# Patient Record
Sex: Male | Born: 1970 | Race: Black or African American | Hispanic: No | Marital: Single | Smoking: Current every day smoker
Health system: Southern US, Community
[De-identification: ages and names within clinical notes are randomized; demographics above are authoritative.]

## PROBLEM LIST (undated history)

## (undated) DIAGNOSIS — I10 Essential (primary) hypertension: Secondary | ICD-10-CM

---

## 2017-05-31 ENCOUNTER — Emergency Department (INDEPENDENT_AMBULATORY_CARE_PROVIDER_SITE_OTHER)
Admission: EM | Admit: 2017-05-31 | Discharge: 2017-05-31 | Disposition: A | Discharge status: Home / Self Care | Payer: BLUE CROSS/BLUE SHIELD | Source: Home / Self Care | Attending: Family Medicine | Admitting: Family Medicine

## 2017-05-31 ENCOUNTER — Encounter: Payer: Self-pay | Admitting: Emergency Medicine

## 2017-05-31 DIAGNOSIS — J209 Acute bronchitis, unspecified: Secondary | ICD-10-CM | POA: Diagnosis not present

## 2017-05-31 HISTORY — DX: Essential (primary) hypertension: I10

## 2017-05-31 MED ORDER — IPRATROPIUM-ALBUTEROL 0.5-2.5 (3) MG/3ML IN SOLN
3.0000 mL | Freq: Once | RESPIRATORY_TRACT | Status: AC
  Administered 2017-05-31: 3 mL via RESPIRATORY_TRACT

## 2017-05-31 MED ORDER — ALBUTEROL SULFATE HFA 108 (90 BASE) MCG/ACT IN AERS: 1.0000 | INHALATION_SPRAY | Freq: Four times a day (QID) | RESPIRATORY_TRACT | 0 refills | Status: DC | PRN

## 2017-05-31 MED ORDER — METHYLPREDNISOLONE SODIUM SUCC 40 MG IJ SOLR
80.0000 mg | Freq: Once | INTRAMUSCULAR | Status: AC
  Administered 2017-05-31: 80 mg via INTRAMUSCULAR

## 2017-05-31 MED ORDER — PREDNISONE 20 MG PO TABS: ORAL_TABLET | ORAL | 0 refills | Status: DC

## 2017-05-31 MED ORDER — BENZONATATE 100 MG PO CAPS: 100.0000 mg | ORAL_CAPSULE | Freq: Three times a day (TID) | ORAL | 0 refills | Status: DC

## 2017-05-31 MED ORDER — AZITHROMYCIN 250 MG PO TABS: 250.0000 mg | ORAL_TABLET | Freq: Every day | ORAL | 0 refills | Status: DC

## 2017-05-31 NOTE — Discharge Instructions (Signed)
°  You were given a shot of solumedrol (a steroid) today to help with inflammation in your lungs to help you breath better and to help with your cough.  You have been prescribed 5 days of prednisone, an oral steroid.  You may start this medication tomorrow with breakfast.   ° °You may take 500mg acetaminophen every 4-6 hours or in combination with ibuprofen 400-600mg every 6-8 hours as needed for pain, inflammation, and fever. ° °Be sure to drink at least eight 8oz glasses of water to stay well hydrated and get at least 8 hours of sleep at night, preferably more while sick.  ° °Please take antibiotics as prescribed and be sure to complete entire course even if you start to feel better to ensure infection does not come back. ° °

## 2017-05-31 NOTE — ED Triage Notes (Signed)
[facility] with [facility]/O cough, cold and congestion, chills/sweating possible fever, feels flu like. Fatigue

## 2017-05-31 NOTE — ED Provider Notes (Signed)
[facility]    CSN: [id number] Arrival date & time: 05/31/17  [id number]     History   Chief Complaint Chief Complaint  Patient presents with  . Cough  . Nasal Congestion  . Fever    HPI Ruben Perez is a 47 y.o. male.   HPI  Ruben Perez is a 47 y.o. male presenting to UC with c/o 6 days of worsening cough, chest tightness, body aches and fatigue. He reports going to the store to get some soup yesterday and "felt like I was going to have to call an Benedetto Goad[employer]ber to drive me home I was so worn out." pt is a truck driver. He notes his girlfriend was sick last week and after he left for work, he started to feel sick.  He did not get the flu vaccine this year.  No hx of asthma. He believes he may have had bronchitis in the past but never needed an inhaler.    Past Medical History:  Diagnosis Date  . Hypertension     There are no active problems to display for this patient.   History reviewed. No pertinent surgical history.     Home Medications    Prior to Admission medications   Medication Sig Start Date End Date Taking? Authorizing Provider  benazepril (LOTENSIN) 20 MG tablet Take 20 mg by mouth daily.   Yes [provider]  albuterol (PROVENTIL HFA;VENTOLIN HFA) 108 (90 Base) MCG/ACT inhaler Inhale 1-2 puffs into the lungs every 6 (six) hours as needed for wheezing or shortness of breath. 05/31/17   Lurene ShadowPhelps, Zimal Weisensel O, PA-C  azithromycin (ZITHROMAX) 250 MG tablet Take 1 tablet (250 mg total) by mouth daily. Take first 2 tablets together, then 1 every day until finished. 05/31/17   Lurene ShadowPhelps, Wasil Wolke O, PA-C  benzonatate (TESSALON) 100 MG capsule Take 1-2 capsules (100-200 mg total) by mouth every 8 (eight) hours. 05/31/17   Lurene ShadowPhelps, Issaic Welliver O, PA-C  predniSONE (DELTASONE) 20 MG tablet 3 tabs po day one, then 2 po daily x 4 days 05/31/17   Lurene ShadowPhelps, Riggin Cuttino O, PA-C    Family History History reviewed. No pertinent family history.  Social History Social History   Tobacco Use  .  Smoking status: Current Every Day Smoker  . Smokeless tobacco: Never Used  Substance Use Topics  . Alcohol use: Yes  . Drug use: No     Allergies   Penicillins   Review of Systems Review of Systems  Constitutional: Positive for fatigue. Negative for chills and fever.  HENT: Positive for congestion and rhinorrhea. Negative for ear pain, sore throat, trouble swallowing and voice change.   Respiratory: Positive for cough, chest tightness, shortness of breath and wheezing.   Cardiovascular: Negative for chest pain and palpitations.  Gastrointestinal: Negative for abdominal pain, diarrhea, nausea and vomiting.  Musculoskeletal: Positive for arthralgias, back pain and myalgias.       Body aches  Skin: Negative for rash.  Neurological: Positive for headaches. Negative for dizziness and light-headedness.     Physical Exam Triage Vital Signs ED Triage Vitals [05/31/17 0953]  Enc Vitals Group     BP 126/80     Pulse Rate 75     Resp 16     Temp 98.8 F (37.1 C)     Temp Source Oral     SpO2 96 %     Weight      Height      Head Circumference      Peak Flow  Pain Score      Pain Loc      Pain Edu?      Excl. in GC?    No data found.  Updated Vital Signs BP 126/80 (BP Location: Right Arm)   Pulse 75   Temp 98.8 F (37.1 C) (Oral)   Resp 16   Ht 5' 10.5" (1.791 m)   Wt 260 lb (117.9 kg)   SpO2 96%   BMI 36.78 kg/m   Visual Acuity Right Eye Distance:   Left Eye Distance:   Bilateral Distance:    Right Eye Near:   Left Eye Near:    Bilateral Near:     Physical Exam  Constitutional: He is oriented to person, place, and time. He appears well-developed and well-nourished. No distress.  HENT:  Head: Normocephalic and atraumatic.  Right Ear: Tympanic membrane normal.  Left Ear: Tympanic membrane normal.  Nose: Nose normal. Right sinus exhibits no maxillary sinus tenderness and no frontal sinus tenderness. Left sinus exhibits no maxillary sinus tenderness and  no frontal sinus tenderness.  Mouth/Throat: Uvula is midline, oropharynx is clear and moist and mucous membranes are normal.  Eyes: EOM are normal.  Neck: Normal range of motion. Neck supple.  Cardiovascular: Normal rate and regular rhythm.  Pulmonary/Chest: Effort normal. No stridor. No respiratory distress. He has no decreased breath sounds. He has wheezes. He has rhonchi. He has rales.  Diffuse wheeze and coarse breath sounds. Able to speak in full sentences. No accessory muscle use.   Musculoskeletal: Normal range of motion.  Neurological: He is alert and oriented to person, place, and time.  Skin: Skin is warm and dry. He is not diaphoretic.  Psychiatric: He has a normal mood and affect. His behavior is normal.  Nursing note and vitals reviewed.    UC Treatments / Results  Labs (all labs ordered are listed, but only abnormal results are displayed) Labs Reviewed - No data to display  EKG  EKG Interpretation None       Radiology No results found.  Procedures Procedures (including critical care time)  Medications Ordered in UC Medications  methylPREDNISolone sodium succinate (SOLU-MEDROL) 40 mg/mL injection 80 mg (80 mg Intramuscular Given 05/31/17 1008)  ipratropium-albuterol (DUONEB) 0.5-2.5 (3) MG/3ML nebulizer solution 3 mL (3 mLs Nebulization Given 05/31/17 1008)     Initial Impression / Assessment and Plan / UC Course  I have reviewed the triage vital signs and the nursing notes.  Pertinent labs & imaging results that were available during my care of the patient were reviewed by me and considered in my medical decision making (see chart for details).     Hx and exam c/w acute bronchitis with bronchospam.  O2 Sat is 96% on RA No evidence of respiratory distress on exam.  Duoneb and solumedrol given in UC Will cover for likely underlying bacterial infection with Azithromycin F/u with PCP in 1 week if needed, sooner if worsening.  Home care instructions provided.     Final Clinical Impressions(s) / UC Diagnoses   Final diagnoses:  Bronchospasm with bronchitis, acute    ED Discharge Orders        Ordered    azithromycin (ZITHROMAX) 250 MG tablet  Daily     05/31/17 1026    predniSONE (DELTASONE) 20 MG tablet     05/31/17 1026    benzonatate (TESSALON) 100 MG capsule  Every 8 hours     05/31/17 1026    albuterol (PROVENTIL HFA;VENTOLIN HFA) 108 (90 Base) MCG/ACT  inhaler  Every 6 hours PRN     05/31/17 1026       Controlled Substance Prescriptions [place] Controlled Substance Registry consulted? Not Applicable   Rolla Plate 05/31/17 1038

## 2017-06-06 ENCOUNTER — Emergency Department (INDEPENDENT_AMBULATORY_CARE_PROVIDER_SITE_OTHER)
Admission: EM | Admit: 2017-06-06 | Discharge: 2017-06-06 | Disposition: A | Discharge status: Home / Self Care | Payer: BLUE CROSS/BLUE SHIELD | Source: Home / Self Care | Attending: Family Medicine | Admitting: Family Medicine

## 2017-06-06 ENCOUNTER — Encounter: Payer: Self-pay | Admitting: Emergency Medicine

## 2017-06-06 ENCOUNTER — Emergency Department (INDEPENDENT_AMBULATORY_CARE_PROVIDER_SITE_OTHER): Payer: BLUE CROSS/BLUE SHIELD

## 2017-06-06 DIAGNOSIS — J9801 Acute bronchospasm: Secondary | ICD-10-CM | POA: Diagnosis not present

## 2017-06-06 DIAGNOSIS — R918 Other nonspecific abnormal finding of lung field: Secondary | ICD-10-CM

## 2017-06-06 DIAGNOSIS — F17200 Nicotine dependence, unspecified, uncomplicated: Secondary | ICD-10-CM

## 2017-06-06 MED ORDER — METHYLPREDNISOLONE ACETATE 80 MG/ML IJ SUSP
120.0000 mg | Freq: Once | INTRAMUSCULAR | Status: AC
  Administered 2017-06-06: 120 mg via INTRAMUSCULAR

## 2017-06-06 MED ORDER — AEROCHAMBER PLUS W/MASK MISC: 2 refills | Status: DC

## 2017-06-06 MED ORDER — PREDNISONE 20 MG PO TABS: ORAL_TABLET | ORAL | 0 refills | Status: DC

## 2017-06-06 MED ORDER — IPRATROPIUM-ALBUTEROL 0.5-2.5 (3) MG/3ML IN SOLN
3.0000 mL | Freq: Once | RESPIRATORY_TRACT | Status: AC
  Administered 2017-06-06: 3 mL via RESPIRATORY_TRACT

## 2017-06-06 NOTE — ED Triage Notes (Signed)
Pt c/o wheezing at night, fatigue and vomiting today. States he has been sick all week. He completed prednisone with no relief.

## 2017-06-06 NOTE — ED Provider Notes (Signed)
[facility]    CSN: [id number] Arrival date & time: 06/06/17  1805     History   Chief Complaint Chief Complaint  Patient presents with  . Fatigue    HPI Ruben Perez is a 47 y.o. male.   HPI  Ruben Perez is a 47 y.o. male presenting to UC with c/o persistent fatigue with chest tightness and SOB despite completing a course of azithromycin and prednisone last week for bronchitis.  He was seen initially at [facility] on 05/31/17.  Pt reports vomiting earlier today from coughing.  No prior hx of asthma.  He is a smoker.    Past Medical History:  Diagnosis Date  . Hypertension     There are no active problems to display for this patient.   History reviewed. No pertinent surgical history.     Home Medications    Prior to Admission medications   Medication Sig Start Date End Date Taking? Authorizing Provider  albuterol (PROVENTIL HFA;VENTOLIN HFA) 108 (90 Base) MCG/ACT inhaler Inhale 1-2 puffs into the lungs every 6 (six) hours as needed for wheezing or shortness of breath. 05/31/17   Lurene Shadow, PA-C  azithromycin (ZITHROMAX) 250 MG tablet Take 1 tablet (250 mg total) by mouth daily. Take first 2 tablets together, then 1 every day until finished. 05/31/17   Lurene Shadow, PA-C  benazepril (LOTENSIN) 20 MG tablet Take 20 mg by mouth daily.    [provider]  benzonatate (TESSALON) 100 MG capsule Take 1-2 capsules (100-200 mg total) by mouth every 8 (eight) hours. 05/31/17   Lurene Shadow, PA-C  predniSONE (DELTASONE) 20 MG tablet 3 tabs po day one, then 2 po daily x 4 days 06/06/17   Lurene Shadow, PA-C  Spacer/Aero-Holding Chambers (AEROCHAMBER PLUS WITH MASK) inhaler Use as instructed 06/06/17   Lurene Shadow, PA-C    Family History History reviewed. No pertinent family history.  Social History Social History   Tobacco Use  . Smoking status: Current Every Day Smoker    Packs/day: 1.00    Types: Cigarettes  . Smokeless tobacco: Never Used    Substance Use Topics  . Alcohol use: Yes  . Drug use: No     Allergies   Penicillins   Review of Systems Review of Systems  Constitutional: Positive for fatigue. Negative for chills and fever.  HENT: Positive for congestion. Negative for ear pain, sore throat, trouble swallowing and voice change.   Respiratory: Positive for cough, chest tightness and wheezing. Negative for shortness of breath.   Cardiovascular: Negative for chest pain and palpitations.  Gastrointestinal: Positive for vomiting ( post-tussive). Negative for abdominal pain, diarrhea and nausea.  Musculoskeletal: Negative for arthralgias, back pain and myalgias.  Skin: Negative for rash.  Neurological: Positive for weakness ( generalized). Negative for dizziness, light-headedness and headaches.     Physical Exam Triage Vital Signs ED Triage Vitals [06/06/17 1902]  Enc Vitals Group     BP (!) 149/83     Pulse Rate 79     Resp      Temp 98 F (36.7 C)     Temp Source Oral     SpO2 96 %     Weight 260 lb (117.9 kg)     Height      Head Circumference      Peak Flow      Pain Score 0     Pain Loc      Pain Edu?  Excl. in GC?    No data found.  Updated Vital Signs BP (!) 149/83 (BP Location: Right Arm)   Pulse 79   Temp 98 F (36.7 C) (Oral)   Wt 260 lb (117.9 kg)   SpO2 96%   BMI 36.78 kg/m   Visual Acuity Right Eye Distance:   Left Eye Distance:   Bilateral Distance:    Right Eye Near:   Left Eye Near:    Bilateral Near:     Physical Exam  Constitutional: He is oriented to person, place, and time. He appears well-developed and well-nourished. No distress.  HENT:  Head: Normocephalic and atraumatic.  Right Ear: Tympanic membrane normal.  Left Ear: Tympanic membrane normal.  Nose: Nose normal. Right sinus exhibits no maxillary sinus tenderness and no frontal sinus tenderness. Left sinus exhibits no maxillary sinus tenderness and no frontal sinus tenderness.  Mouth/Throat: Uvula is  midline, oropharynx is clear and moist and mucous membranes are normal.  Eyes: EOM are normal.  Neck: Normal range of motion. Neck supple.  Cardiovascular: Normal rate and regular rhythm.  Pulmonary/Chest: Effort normal. He has wheezes. He has rhonchi.  Diffuse wheeze and coarse breath sounds. Able to speak in full sentences. No accessory muscle use.   Musculoskeletal: Normal range of motion.  Neurological: He is alert and oriented to person, place, and time.  Skin: Skin is warm and dry. He is not diaphoretic.  Psychiatric: He has a normal mood and affect. His behavior is normal.  Nursing note and vitals reviewed.    UC Treatments / Results  Labs (all labs ordered are listed, but only abnormal results are displayed) Labs Reviewed - No data to display  EKG  EKG Interpretation None       Radiology No results found.  CLINICAL DATA:  Wheezing and shortness of breath  EXAM: CHEST  2 VIEW  COMPARISON:  None.  FINDINGS: Possible 8 mm nodule in the left lateral lung. The heart, hila, mediastinum, lungs, and pleura are otherwise normal.  IMPRESSION: Small nodular density in the lateral left lung could represent a nipple shadow. Recommend repeat with nipple markers for confirmation. No focal infiltrate.   Electronically Signed   By: Gerome Samavid  Williams III M.D   On: 06/06/2017 19:21    CLINICAL DATA:  47 year old male with possible left lung nodule versus nipple shadow on chest radiographs earlier today. Subsequent encounter.  EXAM: CHEST 1 VIEW  COMPARISON:  Chest radiographs 1940 hr today.  FINDINGS: PA view with nipple markers. The left nipple marker corresponds to the questioned small lung nodule on the earlier radiographs. The lungs are clear. Normal cardiac size and mediastinal contours.  IMPRESSION: 1. Left nipple shadow confirmed on the earlier radiographs. 2.  No acute cardiopulmonary abnormality.   Electronically Signed   By: Odessa FlemingH  Hall  M.D.   On: 06/06/2017 19:45  Procedures Procedures (including critical care time)  Medications Ordered in UC Medications  methylPREDNISolone acetate (DEPO-MEDROL) injection 120 mg (120 mg Intramuscular Given 06/06/17 1952)  ipratropium-albuterol (DUONEB) 0.5-2.5 (3) MG/3ML nebulizer solution 3 mL (3 mLs Nebulization Given 06/06/17 1953)     Initial Impression / Assessment and Plan / UC Course  I have reviewed the triage vital signs and the nursing notes.  Pertinent labs & imaging results that were available during my care of the patient were reviewed by me and considered in my medical decision making (see chart for details).     Hx and exam c/w another bronchospasm.  Depo-medrol and duoneb  given in UC Lung sounds: clear after treatment No indication for additional antibiotics  At this time. Will give another course of prednisone along with a Spacer for pt to use with his albuterol inhaler Encouraged to stop smoking to help with his breathing Encouraged fluids and rest F/u with PCP next week as needed Discussed symptoms that warrant emergent care in the ED.   Final Clinical Impressions(s) / UC Diagnoses   Final diagnoses:  Acute bronchospasm  Current smoker    ED Discharge Orders        Ordered    Spacer/Aero-Holding Chambers (AEROCHAMBER PLUS WITH MASK) inhaler     06/06/17 2008    predniSONE (DELTASONE) 20 MG tablet     06/06/17 2008       Controlled Substance Prescriptions [place] Controlled Substance Registry consulted? Not Applicable   Rolla Plate 06/09/17 2440

## 2019-04-22 ENCOUNTER — Emergency Department
Admission: EM | Admit: 2019-04-22 | Discharge: 2019-04-22 | Disposition: A | Discharge status: Home / Self Care | Payer: Self-pay | Source: Home / Self Care

## 2019-04-22 ENCOUNTER — Encounter: Payer: Self-pay | Admitting: Emergency Medicine

## 2019-04-22 ENCOUNTER — Other Ambulatory Visit: Payer: Self-pay

## 2019-04-22 DIAGNOSIS — K0889 Other specified disorders of teeth and supporting structures: Secondary | ICD-10-CM

## 2019-04-22 MED ORDER — CLINDAMYCIN HCL 300 MG PO CAPS: 300.0000 mg | ORAL_CAPSULE | Freq: Four times a day (QID) | ORAL | 0 refills | Status: AC

## 2019-04-22 MED ORDER — HYDROCODONE-ACETAMINOPHEN 5-325 MG PO TABS: 1.0000 | ORAL_TABLET | ORAL | 0 refills | Status: AC | PRN

## 2019-04-22 NOTE — ED Triage Notes (Signed)
RT lower tooth x 1 week

## 2019-04-22 NOTE — Discharge Instructions (Addendum)
See the dentist on Monday as scheduled

## 2019-04-25 NOTE — ED Provider Notes (Signed)
[facility]    CSN: [id number] Arrival date & time: 04/22/19  1413      History   Chief Complaint Chief Complaint  Patient presents with  . Dental Pain    HPI Ruben Perez is a 49 y.o. male.   The history is provided by the patient. No language interpreter was used.  Dental Pain Location:  Lower Lower teeth location:  30/RL 1st molar Quality:  Aching Severity:  Mild Onset quality:  Gradual Timing:  Constant Progression:  Worsening Chronicity:  New Context: abscess   Worsened by:  Nothing Ineffective treatments:  None tried Associated symptoms: gum swelling     Past Medical History:  Diagnosis Date  . Hypertension     There are no problems to display for this patient.   No past surgical history on file.     Home Medications    Prior to Admission medications   Medication Sig Start Date End Date Taking? Authorizing Provider  benazepril (LOTENSIN) 20 MG tablet Take 20 mg by mouth daily.    [provider]  clindamycin (CLEOCIN) 300 MG capsule Take 1 capsule (300 mg total) by mouth 4 (four) times daily for 10 days. 04/22/19 05/02/19  Elson Areas, PA-C  HYDROcodone-acetaminophen (NORCO/VICODIN) 5-325 MG tablet Take 1 tablet by mouth every 4 (four) hours as needed for up to 3 days for moderate pain. 04/22/19 04/25/19  Elson Areas, PA-C    Family History No family history on file.  Social History Social History   Tobacco Use  . Smoking status: Current Every Day Smoker    Packs/day: 1.00    Types: Cigarettes  . Smokeless tobacco: Never Used  Substance Use Topics  . Alcohol use: Yes  . Drug use: No     Allergies   Penicillins   Review of Systems Review of Systems  All other systems reviewed and are negative.    Physical Exam Triage Vital Signs ED Triage Vitals  Enc Vitals Group     BP 04/22/19 1541 (!) 179/127     Pulse Rate 04/22/19 1541 68     Resp --      Temp 04/22/19 1541 98.3 F (36.8 C)     Temp  Source 04/22/19 1541 Oral     SpO2 04/22/19 1541 100 %     Weight --      Height --      Head Circumference --      Peak Flow --      Pain Score 04/22/19 1542 10     Pain Loc --      Pain Edu? --      Excl. in GC? --    No data found.  Updated Vital Signs BP (!) 179/127 (BP Location: Right Arm)   Pulse 68   Temp 98.3 F (36.8 C) (Oral)   SpO2 100%   Visual Acuity Right Eye Distance:   Left Eye Distance:   Bilateral Distance:    Right Eye Near:   Left Eye Near:    Bilateral Near:     Physical Exam Vitals and nursing note reviewed.  Constitutional:      Appearance: He is well-developed.  HENT:     Head: Normocephalic and atraumatic.     Mouth/Throat:     Comments: Swelling lower gumline, multiple cavities  Eyes:     Conjunctiva/sclera: Conjunctivae normal.  Cardiovascular:     Rate and Rhythm: Normal rate and regular rhythm.     Heart sounds:  No murmur.  Pulmonary:     Effort: Pulmonary effort is normal. No respiratory distress.     Breath sounds: Normal breath sounds.  Abdominal:     Tenderness: There is no abdominal tenderness.  Musculoskeletal:     Cervical back: Neck supple.  Skin:    General: Skin is warm and dry.  Neurological:     General: No focal deficit present.     Mental Status: He is alert.  Psychiatric:        Mood and Affect: Mood normal.      UC Treatments / Results  Labs (all labs ordered are listed, but only abnormal results are displayed) Labs Reviewed - No data to display  EKG   Radiology No results found.  Procedures Procedures (including critical care time)  Medications Ordered in UC Medications - No data to display  Initial Impression / Assessment and Plan / UC Course  I have reviewed the triage vital signs and the nursing notes.  Pertinent labs & imaging results that were available during my care of the patient were reviewed by me and considered in my medical decision making (see chart for details).     MDM  Pt  advised to follow up with dentist.   Final Clinical Impressions(s) / UC Diagnoses   Final diagnoses:  Toothache     Discharge Instructions     See the dentist on Monday as scheduled    ED Prescriptions    Medication Sig Dispense Auth. Provider   clindamycin (CLEOCIN) 300 MG capsule Take 1 capsule (300 mg total) by mouth 4 (four) times daily for 10 days. 40 capsule Zerenity Bowron K, [place]   HYDROcodone-acetaminophen (NORCO/VICODIN) 5-325 MG tablet Take 1 tablet by mouth every 4 (four) hours as needed for up to 3 days for moderate pain. 12 tablet Fransico Meadow, [place]     I have reviewed the PDMP during this encounter.  An After Visit Summary was printed and given to the patient.    Fransico Meadow, [place] 04/25/19 1437

## 2019-10-11 IMAGING — DX DG CHEST 1V
1 series · 1 of 1 positions shown · non-contrast
Comparison: Chest radiographs 3407 hr today.

CLINICAL DATA: 47-year-old male with possible left lung nodule
versus nipple shadow on chest radiographs earlier today. Subsequent
encounter.

EXAM:
CHEST 1 VIEW

[chest pa]
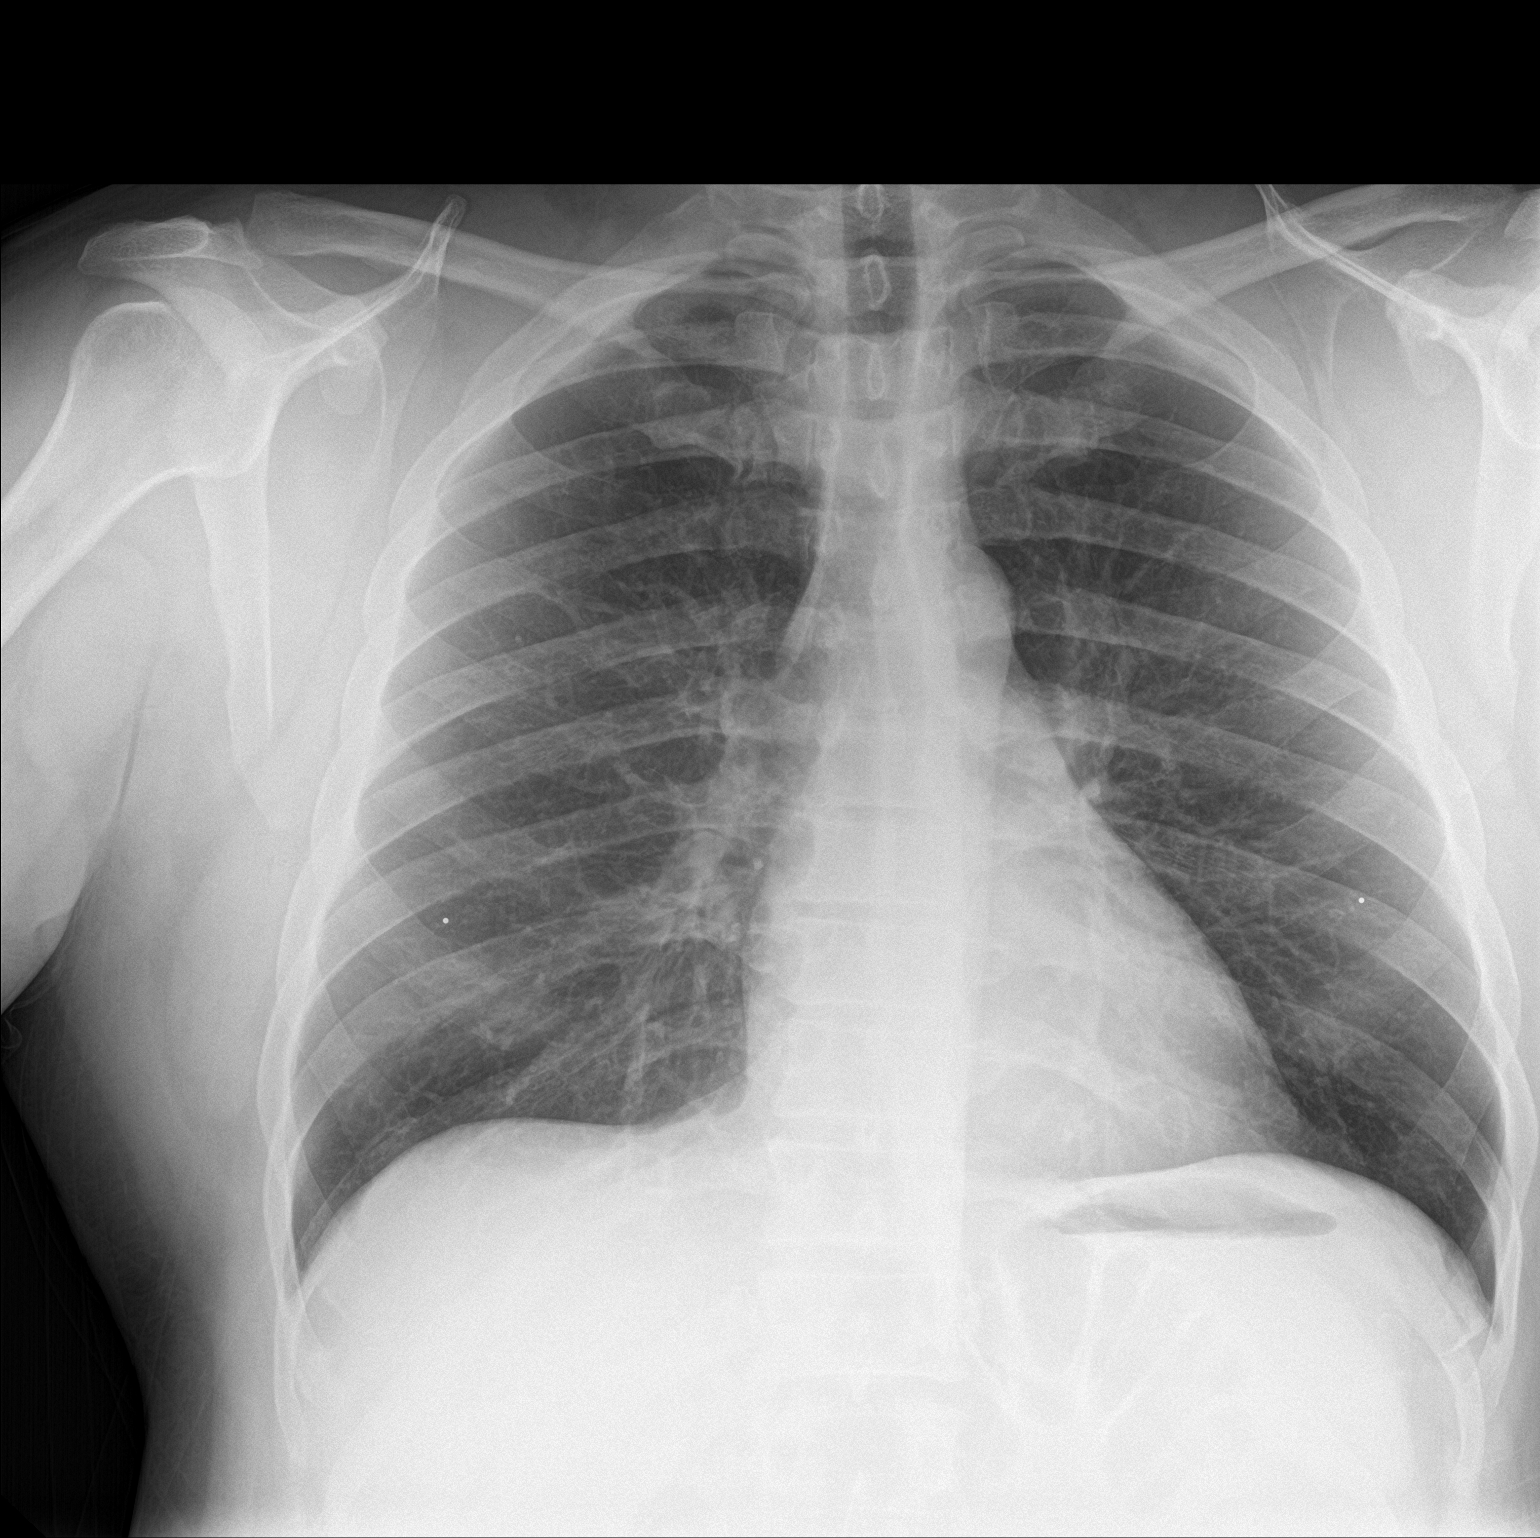

[1 of 1 positions shown; findings below may reference images not displayed]

FINDINGS: PA view with nipple markers. The left nipple marker corresponds to
the questioned small lung nodule on the earlier radiographs. The
lungs are clear. Normal cardiac size and mediastinal contours.
IMPRESSION: 1. Left nipple shadow confirmed on the earlier radiographs.
2.  No acute cardiopulmonary abnormality.
